# Patient Record
Sex: Female | Born: 1995 | Hispanic: Yes | Marital: Married | Smoking: Never smoker
Health system: Southern US, Community
[De-identification: ages and names within clinical notes are randomized; demographics above are authoritative.]

## PROBLEM LIST (undated history)

## (undated) ENCOUNTER — Inpatient Hospital Stay (HOSPITAL_COMMUNITY): Payer: Self-pay

## (undated) DIAGNOSIS — Z789 Other specified health status: Secondary | ICD-10-CM

---
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
## 2015-01-19 ENCOUNTER — Encounter (HOSPITAL_COMMUNITY): Payer: Self-pay | Admitting: *Deleted

## 2015-01-19 ENCOUNTER — Inpatient Hospital Stay (HOSPITAL_COMMUNITY)
Admission: AD | Admit: 2015-01-19 | Discharge: 2015-01-19 | Disposition: A | Discharge status: Home / Self Care | Payer: Self-pay | Source: Ambulatory Visit | Attending: Family Medicine | Admitting: Family Medicine

## 2015-01-19 DIAGNOSIS — Z975 Presence of (intrauterine) contraceptive device: Secondary | ICD-10-CM

## 2015-01-19 DIAGNOSIS — N939 Abnormal uterine and vaginal bleeding, unspecified: Secondary | ICD-10-CM | POA: Insufficient documentation

## 2015-01-19 DIAGNOSIS — N921 Excessive and frequent menstruation with irregular cycle: Secondary | ICD-10-CM

## 2015-01-19 DIAGNOSIS — R634 Abnormal weight loss: Secondary | ICD-10-CM | POA: Insufficient documentation

## 2015-01-19 LAB — WET PREP, GENITAL
CLUE CELLS WET PREP: NONE SEEN
Trich, Wet Prep: NONE SEEN
YEAST WET PREP: NONE SEEN

## 2015-01-19 LAB — CBC
HCT: 37.6 % (ref 36.0–46.0)
HEMOGLOBIN: 13 g/dL (ref 12.0–15.0)
MCH: 29.9 pg (ref 26.0–34.0)
MCHC: 34.6 g/dL (ref 30.0–36.0)
MCV: 86.4 fL (ref 78.0–100.0)
Platelets: 263 10*3/uL (ref 150–400)
RBC: 4.35 MIL/uL (ref 3.87–5.11)
RDW: 11.1 % — AB (ref 11.5–15.5)
WBC: 7.1 10*3/uL (ref 4.0–10.5)

## 2015-01-19 LAB — URINALYSIS, ROUTINE W REFLEX MICROSCOPIC
Bilirubin Urine: NEGATIVE
Glucose, UA: NEGATIVE mg/dL
Ketones, ur: NEGATIVE mg/dL
LEUKOCYTES UA: NEGATIVE
NITRITE: NEGATIVE
Protein, ur: NEGATIVE mg/dL
Specific Gravity, Urine: >1.03 — ABNORMAL HIGH (ref 1.005–1.030)
UROBILINOGEN UA: 0.2 mg/dL (ref 0.0–1.0)
pH: 5.5 (ref 5.0–8.0)

## 2015-01-19 LAB — POCT PREGNANCY, URINE: Preg Test, Ur: NEGATIVE

## 2015-01-19 LAB — URINE MICROSCOPIC-ADD ON

## 2015-01-19 LAB — TSH: TSH: 0.841 u[IU]/mL (ref 0.350–4.500)

## 2015-01-19 MED ORDER — NORGESTIMATE-ETH ESTRADIOL 0.25-35 MG-MCG PO TABS: 1.0000 | ORAL_TABLET | Freq: Every day | ORAL | Status: DC

## 2015-01-19 NOTE — Progress Notes (Signed)
Assisted RN and midwife with interpretation of patient assessment, results and patient discharge.  Spanish Interpreter

## 2015-01-19 NOTE — MAU Note (Signed)
Pt presents to MAU with complaints of having vaginal bleeding since her delivery in November, has an implanon and vaginal bleeding is getting heavier

## 2015-01-19 NOTE — MAU Provider Note (Signed)
Chief Complaint: Vaginal Bleeding   First Provider Initiated Contact with Patient 01/19/15 1917      SUBJECTIVE HPI: Vicki Boone is a 19 y.o. [id number] female who presents to [facility] reporting vaginal bleeding 2 months. Had Nexplanon placed 2 days postpartum in November. Bled frequently x 2 months, stopped x 1 month and has been bleeding most days x 2 months. Denies abd pain, dizziness, vaginal discharge. She states she is in a mutually monogamous relationship.  Past Medical History  Diagnosis Date  . Medical history non-contributory    OB History  Gravida Para Term Preterm AB SAB TAB Ectopic Multiple Living  2 1 1  0 1 0 1 0 0 1    # Outcome Date GA Lbr Len/2nd Weight Sex Delivery Anes PTL Lv  2 Term 08/20/14 [id number] 133:30 / 00:20 7 lb 3.5 oz (3.275 kg) M Vag-Spont EPI  Y  1 TAB              Comments: for hyperemesis     Past Surgical History  Procedure Laterality Date  . Dilation and curettage, diagnostic / therapeutic  2014    TAB  . Appendectomy  2012   History   Social History  . Marital Status: Married    Spouse Name: N/A  . Number of Children: N/A  . Years of Education: N/A   Occupational History  . Not on file.   Social History Main Topics  . Smoking status: Never Smoker   . Smokeless tobacco: Not on file  . Alcohol Use: No  . Drug Use: No  . Sexual Activity: Yes   Other Topics Concern  . Not on file   Social History Narrative   No current facility-administered medications on file prior to encounter.   Current Outpatient Prescriptions on File Prior to Encounter  Medication Sig Dispense Refill  . ibuprofen (ADVIL,MOTRIN) 600 MG tablet Take 1 tablet (600 mg total) by mouth every 6 (six) hours as needed. 50 tablet 1  . Prenatal Vit-Fe Fumarate-FA (PRENATAL MULTIVITAMIN) TABS tablet Take 1 tablet by mouth at bedtime.      Allergies  Allergen Reactions  . Latex     Vaginal burning with latex condoms   Review of Systems   Constitutional: Positive for weight loss. Negative for fever, chills and malaise/fatigue.  Gastrointestinal: Negative for abdominal pain.  Genitourinary: Negative for hematuria.       Pos fr vaginal bleeding. Neg for vaginal discharge.  Neurological: Negative for dizziness and weakness.  Endo/Heme/Allergies: Does not bruise/bleed easily.   OBJECTIVE Blood pressure 114/77, pulse 77, temperature 98.2 F (36.8 C), resp. rate 18, height 5' 0.5" (1.537 m), weight 93 lb 6 oz (42.355 kg), unknown if currently breastfeeding. Not currently breastfeeding.  GENERAL: Well-developed, well-nourished female in no acute distress. No pallor.  HEENT: Mild thyromegaly HEART: normal rate RESP: normal effort GI: Abdomen soft, non-tender. Positive bowel sounds 4. MS: Nontender, no edema NEURO: Alert and oriented SPECULUM EXAM: NEFG. Small amount of dark red blood noted, cervix clean BIMANUAL: cervix closed; uterus normal size, no adnexal tenderness or masses, No CMT.   LAB RESULTS Results for orders placed or performed during the hospital encounter of 01/19/15 (from the past 24 hour(s))  Urinalysis, Routine w reflex microscopic     Status: Abnormal   Collection Time: 01/19/15  5:40 PM  Result Value Ref Range   Color, Urine YELLOW YELLOW   APPearance CLEAR CLEAR   Specific Gravity, Urine >1.030 (H) 1.005 - 1.030  pH 5.5 5.0 - 8.0   Glucose, UA NEGATIVE NEGATIVE mg/dL   Hgb urine dipstick LARGE (A) NEGATIVE   Bilirubin Urine NEGATIVE NEGATIVE   Ketones, ur NEGATIVE NEGATIVE mg/dL   Protein, ur NEGATIVE NEGATIVE mg/dL   Urobilinogen, UA 0.2 0.0 - 1.0 mg/dL   Nitrite NEGATIVE NEGATIVE   Leukocytes, UA NEGATIVE NEGATIVE  Urine microscopic-add on     Status: None   Collection Time: 01/19/15  5:40 PM  Result Value Ref Range   Squamous Epithelial / LPF RARE RARE   RBC / HPF 21-50 <3 RBC/hpf   Urine-Other MUCOUS PRESENT   Pregnancy, urine POC     Status: None   Collection Time: 01/19/15  5:45 PM   Result Value Ref Range   Preg Test, Ur NEGATIVE NEGATIVE    IMAGING No results found.  MAU COURSE  ASSESSMENT 1. Breakthrough bleeding on Implanon   2. Unexplained weight loss    PLAN Discharge home in stable condition. Bleeding precautions.  TSH, GC/Chlamydia pending.  Follow-up Information    Follow up with [facility] In 4 months.   Specialty:  Obstetrics and Gynecology   Why:  For follow-up visit for prolonged menstrual bleeding with  Nexplanon   Contact information:   [address] [place] [address] [phone]      Follow up with [facility] [place] [facility].   Why:  As needed in emergencies   Contact information:   [address] [id number] mc [place] [address] [phone]       Medication List    TAKE these medications        ibuprofen 600 MG tablet  Commonly known as:  ADVIL,MOTRIN  Take 1 tablet (600 mg total) by mouth every 6 (six) hours as needed.     norgestimate-ethinyl estradiol 0.25-35 MG-MCG tablet  Commonly known as:  ORTHO-CYCLEN,SPRINTEC,PREVIFEM  Take 1 tablet by mouth daily.     prenatal multivitamin Tabs tablet  Take 1 tablet by mouth at bedtime.       [place], [place] 01/19/2015  6:21 PM

## 2015-01-19 NOTE — Discharge Instructions (Signed)
Sangrado uterino anormal (Abnormal Uterine Bleeding) El sangrado uterino anormal puede afectar a las mujeres que estn en diversas etapas de la vida, desde adolescentes, mujeres frtiles y Probation officermujeres embarazadas, hasta mujeres que han llegado a la menopausia. Hay diversas clases de sangrado uterino que se consideran anormales, entre ellas:  Prdidas de sangre o Nationwide Mutual Insurancehemorragias entre los perodos.  Hemorragias luego de Sales promotion account executivemantener relaciones sexuales.  Sangrado abundante o ms que lo habitual.  Perodos que duran ms que lo normal.  Sangrado luego de la menopausia. Muchos casos de sangrado uterino anormal son leves y simples de tratar, mientras que otros son ms graves. El mdico debe evaluar cualquier clase de sangrado anormal. El tratamiento depender de la causa del sangrado. INSTRUCCIONES PARA EL CUIDADO EN EL HOGAR Controle su afeccin para ver si hay cambios. Las siguientes indicaciones ayudarn a Architectural technologistaliviar cualquier molestia que pueda sentir:  Evite las duchas vaginales y el uso de tampones segn las indicaciones del mdico.  Cmbiese las compresas con frecuencia. Deber hacerse exmenes plvicos regulares y pruebas de Papanicolaou. Cumpla con todas las visitas de control y Ciscoexmanes diagnsticos, segn le indique su mdico.  SOLICITE ATENCIN MDICA SI:   El sangrado dura ms de 1 semana.  Se siente mareada por momentos. SOLICITE ATENCIN MDICA DE INMEDIATO SI:   Se desmaya.  Debe cambiarse la compresa cada 15 a 30 minutos.  Siente dolor abdominal.  Lance Mussiene fiebre.  Se siente dbil o presenta sudoracin.  Elimina cogulos grandes por la vagina.  Comienza a sentir nuseas y vomita. ASEGRESE DE QUE:   Comprende estas instrucciones.  Controlar su afeccin.  Recibir ayuda de inmediato si no mejora o si empeora. Document Released: 09/27/2005 Document Revised: 10/02/2013 [facility] Patient Information 2015 HenryExitCare, MarylandLLC. This information is not intended to replace advice given  to you by your health care provider. Make sure you discuss any questions you have with your health care provider.  Etonogestrel implant Qu es este medicamento? El ETONOGESTREL es un dispositivo anticonceptivo (control de la natalidad). Se utiliza para Neurosurgeonprevenir el embarazo. Se puede utilizar hasta 3 aos. Este medicamento puede ser utilizado para otros usos; si tiene alguna pregunta consulte con su proveedor de atencin mdica o con su farmacutico. MARCAS COMERCIALES DISPONIBLES: Implanon, Nexplanon Qu le debo informar a mi profesional de la salud antes de tomar este medicamento? Necesita saber si usted presenta alguno de los siguientes problemas o situaciones: -sangrado vaginal anormal -enfermedad vascular o cogulos sanguneos -cncer de mama, cervical, heptico -depresin -diabetes -enfermedad de la vescula biliar -dolores de cabeza -enfermedad cardiaca o ataque cardiaco reciente -alta presin sangunea -alto nivel de colesterol -enfermedad renal -enfermedad heptica -convulsiones -fuma tabaco -una reaccin alrgica o inusual al etonogestrel, otras hormonas, anestsicos o antispticos, medicamentos, alimentos, colorantes o conservantes -si est embarazada o buscando quedar embarazada -si est amamantando a un beb Cmo debo utilizar este medicamento? Este dispositivo se inserta debajo de la piel en la cara interna de la parte superior del brazo por un profesional de Radiographer, therapeuticla salud. Hable con su pediatra para informarse acerca del uso de este medicamento en nios. Puede requerir atencin especial. Sobredosis: Pngase en contacto inmediatamente con un centro toxicolgico o una sala de urgencia si usted cree que haya tomado demasiado medicamento. ATENCIN: Reynolds AmericanEste medicamento es solo para usted. No comparta este medicamento con nadie. Qu sucede si me olvido de una dosis? No se aplica en este caso. Qu puede interactuar con este medicamento? No tome esta medicina con ninguno de los  siguientes medicamentos: -amprenavir -bosentano -fosamprenavir  Esta medicina tambin puede interactuar con los siguientes medicamentos: -medicamentos barbitricos para inducir el sueo o tratar convulsiones -ciertos medicamentos para las infecciones micticas tales como quetoconazol e itraconazol -griseofulvina -medicamentos para tratar convulsiones, tales como carbamazepina, felbamato, oxcarbazepina, fenitona, topiramato -modafinil -fenilbutazona -rifampicina -algunos medicamentos para tratar la infeccin por VIH tales como atazanavir, indinavir, lopinavir, nelfinavir, tipranavir, ritonavir -hierba de [address] ser que esta lista no menciona todas las posibles interacciones. Informe a su profesional de [employer] de [employer] productos a base de hierbas, medicamentos de [place] o suplementos nutritivos que est tomando. Si usted fuma, consume bebidas alcohlicas o si utiliza drogas ilegales, indqueselo tambin a su profesional de [employer]. Algunas sustancias pueden interactuar con su medicamento. A qu debo estar atento al usar [employer]? Este medicamento no protege contra la infeccin por el VIH (SIDA) o otras enfermedades de transmisin sexual. Usted debe sentir el implante al presionar con las yemas de los dedos sobre la piel donde se insert. Dgale a su mdico si no se siente el implante. Qu efectos secundarios puedo tener al [employer] este medicamento? Efectos secundarios que debe informar a su mdico o a Producer, television/film/video de la salud tan pronto como sea posible: -Therapist, art como erupcin cutnea, picazn o urticarias, hinchazn de la cara, labios o lengua -ndulos mamarios -cambios en la visin -confusin, dificultad para hablar o entender -orina de color oscura -humor deprimido -sensacin general de estar enfermo o sntomas gripales -heces claras -prdida del apetito, nuseas -dolor en la regin abdominal superior derecha -dolores de Scientist, clinical (histocompatibility and immunogenetics), hinchazon o sensibilidad grave en el abdomen -falta de aliento, dolor en el pecho, hinchazn de la pierna -seales de Chartered loss adjuster -entumecimiento o debilidad repentina de la cara, brazo o pierna -dificultad para caminar, mareos, prdida de equilibrio o coordinacin -sangrado o flujo vaginal inusual -cansancio o debilidad inusual -color amarillento de los ojos o la piel Efectos secundarios que, por lo general, no requieren Psychologist, prison and probation services (debe informarlos a su mdico o a Producer, television/film/video de la salud si persisten o si son molestos): -acn -dolor de pecho -cambios de peso -tos -fiebre o escalofros -dolor de cabeza -sangrado menstrual irregular -picazn, ardo o flujo vaginal -dolor o dificultad para Geographical information systems officer -dolor de garganta Puede ser que esta lista no menciona todos los posibles efectos secundarios. Comunquese a su mdico por asesoramiento mdico [employer]. Usted puede informar los efectos secundarios a la FDA por telfono al 1-800-FDA-1088. Dnde debo guardar mi medicina? Este medicamento se administra en hospitales o clnicas y no necesitar guardarlo en su domicilio. ATENCIN: Este folleto es un resumen. Puede ser que no cubra toda la posible informacin. Si usted tiene preguntas acerca de esta medicina, consulte con su mdico, su farmacutico o su profesional de Radiographer, therapeutic.  2015, Elsevier/Gold Standard. (2012-05-04 18:26:08)

## 2015-01-19 NOTE — Progress Notes (Signed)
Assisted RN with interpretation of patient assessment.   °Spanish Interpreter °

## 2015-01-20 ENCOUNTER — Telehealth: Payer: Self-pay | Admitting: General Practice

## 2015-01-20 LAB — GC/CHLAMYDIA PROBE AMP (~~LOC~~) NOT AT ARMC
CHLAMYDIA, DNA PROBE: NEGATIVE
NEISSERIA GONORRHEA: NEGATIVE

## 2015-01-20 NOTE — Telephone Encounter (Signed)
Called patient with Vicki Boone for interpreter and informed patient of results. Patient verbalized understanding and asked about other results. Told patient that some results have come back normal but others haven't resulted yet. Patient verbalized understanding and had no other questions

## 2015-01-20 NOTE — Telephone Encounter (Signed)
-----   Message from Vicki Boone, [place] sent at 01/20/2015  8:26 AM EDT ----- Please inform pt of normal TSH.

## 2015-05-22 ENCOUNTER — Ambulatory Visit: Payer: Self-pay | Admitting: Family Medicine

## 2015-05-24 ENCOUNTER — Emergency Department (HOSPITAL_COMMUNITY): Payer: Self-pay

## 2015-05-24 ENCOUNTER — Encounter (HOSPITAL_COMMUNITY): Payer: Self-pay | Admitting: Oncology

## 2015-05-24 ENCOUNTER — Emergency Department (HOSPITAL_COMMUNITY)
Admission: EM | Admit: 2015-05-24 | Discharge: 2015-05-25 | Disposition: A | Discharge status: Home / Self Care | Payer: Self-pay | Attending: Emergency Medicine | Admitting: Emergency Medicine

## 2015-05-24 DIAGNOSIS — Z91040 Latex allergy status: Secondary | ICD-10-CM | POA: Insufficient documentation

## 2015-05-24 DIAGNOSIS — R112 Nausea with vomiting, unspecified: Secondary | ICD-10-CM | POA: Insufficient documentation

## 2015-05-24 DIAGNOSIS — R202 Paresthesia of skin: Secondary | ICD-10-CM | POA: Insufficient documentation

## 2015-05-24 DIAGNOSIS — R509 Fever, unspecified: Secondary | ICD-10-CM | POA: Insufficient documentation

## 2015-05-24 DIAGNOSIS — M79642 Pain in left hand: Secondary | ICD-10-CM | POA: Insufficient documentation

## 2015-05-24 DIAGNOSIS — R1084 Generalized abdominal pain: Secondary | ICD-10-CM | POA: Insufficient documentation

## 2015-05-24 DIAGNOSIS — Z79899 Other long term (current) drug therapy: Secondary | ICD-10-CM | POA: Insufficient documentation

## 2015-05-24 DIAGNOSIS — F410 Panic disorder [episodic paroxysmal anxiety] without agoraphobia: Secondary | ICD-10-CM | POA: Insufficient documentation

## 2015-05-24 DIAGNOSIS — M79641 Pain in right hand: Secondary | ICD-10-CM | POA: Insufficient documentation

## 2015-05-24 LAB — CBC WITH DIFFERENTIAL/PLATELET
Basophils Absolute: 0 10*3/uL (ref 0.0–0.1)
Basophils Relative: 0 % (ref 0–1)
EOS PCT: 1 % (ref 0–5)
Eosinophils Absolute: 0.1 10*3/uL (ref 0.0–0.7)
HCT: 39.4 % (ref 36.0–46.0)
Hemoglobin: 13.6 g/dL (ref 12.0–15.0)
Lymphocytes Relative: 9 % — ABNORMAL LOW (ref 12–46)
Lymphs Abs: 1.7 10*3/uL (ref 0.7–4.0)
MCH: 29.6 pg (ref 26.0–34.0)
MCHC: 34.5 g/dL (ref 30.0–36.0)
MCV: 85.8 fL (ref 78.0–100.0)
Monocytes Absolute: 1.6 10*3/uL — ABNORMAL HIGH (ref 0.1–1.0)
Monocytes Relative: 8 % (ref 3–12)
NEUTROS PCT: 82 % — AB (ref 43–77)
Neutro Abs: 15.8 10*3/uL — ABNORMAL HIGH (ref 1.7–7.7)
PLATELETS: 348 10*3/uL (ref 150–400)
RBC: 4.59 MIL/uL (ref 3.87–5.11)
RDW: 11.9 % (ref 11.5–15.5)
WBC: 19.2 10*3/uL — AB (ref 4.0–10.5)

## 2015-05-24 LAB — COMPREHENSIVE METABOLIC PANEL
ALK PHOS: 74 U/L (ref 38–126)
ALT: 32 U/L (ref 14–54)
AST: 29 U/L (ref 15–41)
Albumin: 4.6 g/dL (ref 3.5–5.0)
Anion gap: 12 (ref 5–15)
BUN: 12 mg/dL (ref 6–20)
CALCIUM: 9.3 mg/dL (ref 8.9–10.3)
CO2: 21 mmol/L — AB (ref 22–32)
CREATININE: 0.57 mg/dL (ref 0.44–1.00)
Chloride: 106 mmol/L (ref 101–111)
GFR calc non Af Amer: >60 mL/min (ref >60)
Glucose, Bld: 88 mg/dL (ref 65–99)
POTASSIUM: 3.1 mmol/L — AB (ref 3.5–5.1)
Sodium: 139 mmol/L (ref 135–145)
Total Bilirubin: 1.4 mg/dL — ABNORMAL HIGH (ref 0.3–1.2)
Total Protein: 8.1 g/dL (ref 6.5–8.1)

## 2015-05-24 LAB — LIPASE, BLOOD: Lipase: 23 U/L (ref 22–51)

## 2015-05-24 MED ORDER — ONDANSETRON HCL 4 MG/2ML IJ SOLN
4.0000 mg | Freq: Once | INTRAMUSCULAR | Status: AC
  Administered 2015-05-24: 4 mg via INTRAVENOUS
  Filled 2015-05-24: qty 2

## 2015-05-24 MED ORDER — SODIUM CHLORIDE 0.9 % IV BOLUS (SEPSIS)
500.0000 mL | Freq: Once | INTRAVENOUS | Status: AC
  Administered 2015-05-24: 500 mL via INTRAVENOUS

## 2015-05-24 MED ORDER — MORPHINE SULFATE 4 MG/ML IJ SOLN
4.0000 mg | Freq: Once | INTRAMUSCULAR | Status: AC
  Administered 2015-05-24: 4 mg via INTRAVENOUS
  Filled 2015-05-24: qty 1

## 2015-05-24 NOTE — ED Notes (Signed)
Nurse starting IV 

## 2015-05-24 NOTE — ED Notes (Signed)
Bed: [facility]3 Expected date:  Expected time:  Means of arrival:  Comments: EMS 19 yo panic attack

## 2015-05-24 NOTE — ED Notes (Signed)
Per EMS he was flagged down by a car w/ a female pt having a panic attack.  Pt c/o abdominal and leg pain.  Pt has calmed down since arrival.

## 2015-05-24 NOTE — ED Provider Notes (Signed)
CSN: [id number]     Arrival date & time 05/24/15  1940 History  This chart was scribed for non-physician practitioner Earley Favor, NP, working with Mancel Bale, MD, by Tanda Rockers, ED Scribe. This patient was seen in room WTR3/[facility]3 and the patient's care was started at 8:25 PM.  Chief Complaint  Patient presents with  . Panic Attack  . Abdominal Pain   The history is provided by the patient. The history is limited by a language barrier. A language interpreter was used.     HPI Comments: Vicki Boone is a 19 y.o. female brought in by ambulance, who presents to the Emergency Department complaining of sudden onset abdominal pain that began earlier today. Pt also complains of abdominal bloating, nausea, vomiting, subjective fever, chills, bilateral hand pain, and a tingling sensation all over her body. Pt mentions that she felt as if her body was "shrinking." She also mentions that she could not move or talk. All these symptoms lasted approximately 30 minutes before subsided. Pt is currently only complaining of generalized body aches. She states that she felt perfectly normal prior to onset of symptoms. She has never had symptoms like this in the past. 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 Denies dysuria, vaginal discharge, or any other associated symptoms. LNMP: Today.   Past Medical History  Diagnosis Date  . Medical history non-contributory    Past Surgical History  Procedure Laterality Date  . Dilation and curettage, diagnostic / therapeutic  2014    TAB  . Appendectomy  2012   No family history on file. Social History  Substance Use Topics  . Smoking status: Never Smoker   . Smokeless tobacco: None  . Alcohol Use: No   OB History    Gravida Para Term Preterm AB TAB SAB Ectopic Multiple Living   2 1 1  0 1 1 0 0 0 1     Review of Systems   Constitutional: Positive for fever and chills.  HENT: Negative for rhinorrhea.   Respiratory: Negative for cough and shortness of breath.   Cardiovascular: Negative for chest pain.  Gastrointestinal: Positive for nausea, vomiting, abdominal pain and abdominal distention. Negative for diarrhea and constipation.  Genitourinary: Negative for dysuria, frequency, decreased urine volume, vaginal discharge, vaginal pain and pelvic pain.  Musculoskeletal: Positive for myalgias and arthralgias (Bilateral hand pain).  All other systems reviewed and are negative.  Allergies  Latex  Home Medications   Prior to Admission medications   Medication Sig Start Date End Date Taking? Authorizing Provider  Prenatal Vit-Fe Fumarate-FA (PRENATAL MULTIVITAMIN) TABS tablet Take 1 tablet by mouth at bedtime.    Yes Historical Provider, MD  norgestimate-ethinyl estradiol (ORTHO-CYCLEN,SPRINTEC,PREVIFEM) 0.25-35 MG-MCG tablet Take 1 tablet by mouth daily. Patient not taking: Reported on 05/24/2015 01/19/15   Dorathy Kinsman, CNM  ondansetron (ZOFRAN ODT) 4 MG disintegrating tablet Take 1 tablet (4 mg total) by mouth every 8 (eight) hours as needed for nausea or vomiting. 05/25/15   Earley Favor, NP   Triage Vitals: BP 122/74 mmHg  Pulse 98  Temp(Src) 98.9 F (37.2 C) (Oral)  Resp 20  Ht 5\' 3"  (1.6 m)  Wt 90 lb (40.824 kg)  BMI 15.95 kg/m2  SpO2 100%   Physical Exam  Constitutional: She is oriented to person, place, and time. She appears well-developed and well-nourished.  HENT:  Head: Normocephalic.  Eyes: Pupils are equal, round, and reactive to light.  Neck: Normal range of motion.  Cardiovascular: Normal rate and regular rhythm.   Abdominal: She exhibits no distension and no mass. There is tenderness in the epigastric area and left upper quadrant. There is no rebound and no guarding.  Musculoskeletal: Normal range of motion.  Neurological: She is alert and oriented to person, place, and time.  Skin:  Skin is warm and dry.  Nursing note and vitals reviewed.   ED Course  Procedures (including critical care time)  DIAGNOSTIC STUDIES: Oxygen Saturation is 100% on RA, normal by my interpretation.    COORDINATION OF CARE: 8:39 PM-Discussed treatment plan which includes UA, Lipase, CMP, CBC with pt at bedside and pt agreed to plan.   Labs Review Labs Reviewed  CBC WITH DIFFERENTIAL/PLATELET - Abnormal; Notable for the following:    WBC 19.2 (*)    Neutrophils Relative % 82 (*)    Neutro Abs 15.8 (*)    Lymphocytes Relative 9 (*)    Monocytes Absolute 1.6 (*)    All other components within normal limits  URINALYSIS, ROUTINE W REFLEX MICROSCOPIC (NOT AT [facility]) - Abnormal; Notable for the following:    Color, Urine AMBER (*)    APPearance CLOUDY (*)    Hgb urine dipstick LARGE (*)    Ketones, ur 40 (*)    Leukocytes, UA TRACE (*)    All other components within normal limits  COMPREHENSIVE METABOLIC PANEL - Abnormal; Notable for the following:    Potassium 3.1 (*)    CO2 21 (*)    Total Bilirubin 1.4 (*)    All other components within normal limits  URINE MICROSCOPIC-ADD ON - Abnormal; Notable for the following:    Squamous Epithelial / LPF FEW (*)    Bacteria, UA FEW (*)    All other components within normal limits  LIPASE, BLOOD    Imaging Review US Abdomen Complete  05/25/2015   CLINICAL DATA:  Acute onset of generalized abdominal pain. Initial encounter.  EXAM: ULTRASOUND ABDOMEN COMPLETE  COMPARISON:  None.  FINDINGS: Gallbladder: No gallstones or wall thickening visualized. No sonographic Murphy sign noted.  Common bile duct: Diameter: 0.2 cm, within normal limits in caliber.  Liver: No focal lesion identified. Within normal limits in parenchymal echogenicity.  IVC: No abnormality visualized.  Pancreas: Visualized portion unremarkable.  Spleen: Size and appearance within normal limits.  Right Kidney: Length: 9.9 cm. Echogenicity within normal limits. No mass or hydronephrosis  visualized.  Left Kidney: Length: 10.2 cm. Echogenicity within normal limits. No mass or hydronephrosis visualized.  Abdominal aorta: No aneurysm visualized.  Other findings: None.  IMPRESSION: Unremarkable abdominal ultrasound.   Electronically Signed   By: Roanna Raider M.D.   On: 05/25/2015 00:01   Ct Abdomen Pelvis W Contrast  05/25/2015   CLINICAL DATA:  Acute onset of generalized abdominal pain, nausea, vomiting and bloating. Leukocytosis.  EXAM: CT ABDOMEN AND PELVIS WITH CONTRAST  TECHNIQUE: Multidetector CT imaging of the abdomen and pelvis was performed using the standard protocol following bolus administration of intravenous contrast.  CONTRAST:  OMNIPAQUE IOHEXOL 300 MG/ML  SOLN  COMPARISON:  Abdominal ultrasound performed 05/24/2015  FINDINGS: The visualized lung bases are clear.  The liver and spleen are unremarkable in appearance. The gallbladder is within normal limits. The pancreas and adrenal glands are unremarkable.  The kidneys are unremarkable in appearance. There is no evidence of hydronephrosis. No renal or ureteral stones are seen. No perinephric stranding  is appreciated.  The small bowel is unremarkable in appearance. The stomach is within normal limits. No acute vascular abnormalities are seen.  The colon is unremarkable in appearance. The patient is status post appendectomy.  The bladder is mildly distended and grossly unremarkable. The uterus is unremarkable in appearance. The ovaries are grossly symmetric. No suspicious adnexal masses are seen. Trace fluid in the pelvis is likely physiologic in nature. Mild prominence of the periuterine vasculature is likely within normal limits. No inguinal lymphadenopathy is seen.  No acute osseous abnormalities are identified.  IMPRESSION: Unremarkable contrast-enhanced CT of the abdomen and pelvis.   Electronically Signed   By: Roanna Raider M.D.   On: 05/25/2015 05:43      EKG Interpretation None     Labs showed that the patient  has an elevated white count of 19.2 and elevated bilirubin 1.4.  The rest of her LFTs are within normal parameters.  Due to the location of her pain in the right upper and left upper quadrant I have opted to obtain an ultrasound to rule out gallbladder disease, as she is 9 months postpartum.  Her pain started approximately 3 hours after eating Received IV fluid, IV Zofran.  Abdominal ultrasound reveals no abnormal pathology.  She has tolerated fluids.  She'll be with a prescription for Zofran At time of discharge, she walked to the bathroom with assistance of her husband shortly after having a bowel movement.  She felt faint and weak.  She had to be carried back to the room stating that she got hot and anxious.  He should has been sleeping soundly.  No longer complaining of any pain.  She's not had any more bowel movements.  She will be ambulated.  If she is successful with ambulation.  She will be discharged home. MDM   Final diagnoses:  Generalized abdominal pain    I personally performed the services described in this documentation, which was scribed in my presence. The recorded information has been reviewed and is accurate.     Earley Favor, NP 05/25/15 0325  Earley Favor, NP 05/25/15 1610  Mancel Bale, MD 05/25/15 1259

## 2015-05-25 ENCOUNTER — Emergency Department (HOSPITAL_COMMUNITY): Payer: Self-pay

## 2015-05-25 LAB — URINALYSIS, ROUTINE W REFLEX MICROSCOPIC
Bilirubin Urine: NEGATIVE
Glucose, UA: NEGATIVE mg/dL
Ketones, ur: 40 mg/dL — AB
NITRITE: NEGATIVE
Protein, ur: NEGATIVE mg/dL
Specific Gravity, Urine: 1.027 (ref 1.005–1.030)
Urobilinogen, UA: 1 mg/dL (ref 0.0–1.0)
pH: 5.5 (ref 5.0–8.0)

## 2015-05-25 LAB — URINE MICROSCOPIC-ADD ON

## 2015-05-25 MED ORDER — IOHEXOL 300 MG/ML  SOLN
100.0000 mL | Freq: Once | INTRAMUSCULAR | Status: AC | PRN
  Administered 2015-05-25: 100 mL via INTRAVENOUS

## 2015-05-25 MED ORDER — IOHEXOL 300 MG/ML  SOLN
50.0000 mL | Freq: Once | INTRAMUSCULAR | Status: AC | PRN
  Administered 2015-05-25: 50 mL via ORAL

## 2015-05-25 MED ORDER — ONDANSETRON 4 MG PO TBDP: 4.0000 mg | ORAL_TABLET | Freq: Three times a day (TID) | ORAL | Status: DC | PRN

## 2015-05-25 NOTE — ED Notes (Signed)
Pt resting quietly without complaints.

## 2015-05-25 NOTE — ED Notes (Signed)
Pt ambulated without difficulty,  She states she is more relaxed now.

## 2015-05-25 NOTE — ED Notes (Signed)
Pt is in CT

## 2015-05-25 NOTE — ED Notes (Signed)
Patient carried back to room by significant other, stating after using the bathroom, "pooped a little"-states solid, states became faint feeling.  Patient is pale and is unable to stand without assist when performing orthostats.

## 2015-05-25 NOTE — ED Notes (Signed)
Pt returned from CT °

## 2015-05-25 NOTE — Discharge Instructions (Signed)
Dolor abdominal en las mujeres °(Abdominal Pain, Women) °El dolor abdominal (en el estómago, la pelvis o el vientre) puede tener muchas causas. Es importante que le informe a su médico: °· La ubicación del dolor. °· ¿Viene y se va, o persiste todo el tiempo? °· ¿Hay situaciones que inician el dolor (comer ciertos alimentos, la actividad física)? °· ¿Tiene otros síntomas asociados al dolor (fiebre, náuseas, vómitos, diarrea)? °Todo es de gran ayuda cuando se trata de hallar la causa del dolor. °CAUSAS °· Estómago: Infecciones por virus o bacterias, o úlcera. °· Intestino: Apendicitis (apéndice inflamado), ileitis regional (enfermedad de Crohn), colitis ulcerosa (colon inflamado), síndrome del colon irritable, diverticulitis (inflamación de los divertículos del colon) o cáncer de estómago oo intestino. °· Enfermedades de la vesícula biliar o cálculos. °· Enfermedades renales, cálculos o infecciones en el riñón. °· Infección o cáncer del páncreas. °· Fibromialgia (trastorno doloroso) °· Enfermedades de los órganos femeninos: °¨ Uterus: Útero: fibroma (tumor no canceroso) o infección °¨ Trompas de Falopio: infección o embarazo ectópico °¨ En los ovarios, quistes o tumores. °¨ Adherencias pélvicas (tejido cicatrizal). °¨ Endometriosis (el tejido que cubre el útero se desarrolla en la pelvis y los órganos pélvicos). °¨ Síndrome de congestión pélvica (los órganos femeninos se llenan de sangre antes del periodo menstrual( °¨ Dolor durante el periodo menstrual. °¨ Dolor durante la ovulación (al producir óvulos). °¨ Dolor al usar el DIU (dispositivo intrauterino para el control de la natalidad) °¨ Cáncer en los órganos femeninos. °· Dolor funcional (no está originado en una enfermedad, puede mejorar sin tratamiento). °· Dolor de origen psicológico °· Depresión. °DIAGNÓSTICO °Su médico decidirá la gravedad del dolor a través del examen físico °· Análisis de sangre °· Radiografías °· Ecografías °· TC (tomografía computada, tipo  especial de radiografías). °· IMR (resonancia magnética) °· Cultivos, en el caso una infección °· Colon por enema de bario (se inserta una sustancia de contraste en el intestino grueso para mejorar la observación con rayos X.) °· Colonoscopía (observación del intestino con un tubo luminoso). °· Laparoscopía (examen del interior del abdomen con un tubo que tiene una luz). °· Cirugía exploratoria abdominal mayor (se observa el abdomen realizando una gran incisión). °TRATAMIENTO °El tratamiento dependerá de la causa del problema.  °· Muchos de estos casos pueden controlarse y tratarse en casa. °· Medicamentos de venta libre indicados por el médico. °· Medicamentos con receta. °· Antibióticos, en caso de infección °· Píldoras anticonceptivas, en el caso de períodos dolorosos o dolor al ovular. °· Tratamiento hormonal, para la endometriosis °· Inyecciones para bloqueo nervioso selectivo. °· Fisioterapia. °· Antidepresivos. °· Consejos por parte de un psícólogo o psiquiatra. °· Cirugía mayor o menor. °INSTRUCCIONES PARA EL CUIDADO DOMICILIARIO °· No tome ni administre laxantes a menos que se lo haya indicado su médico. °· Tome analgésicos de venta libre sólo si se lo ha indicado el profesional que lo asiste. No tome aspirina, ya que puede causar molestias en el estómago o hemorragias. °· Consuma una dieta líquida (caldo o agua) según lo indicado por el médico. Progrese lentamente a una dieta blanda, según la tolerancia, si el dolor se relaciona con el estómago o el intestino. °· Tenga un termómetro y tómese la temperatura varias veces al día. °· Haga reposo en la cama y duerma, si esto alivia el dolor. °· Evite las relaciones sexuales, si le producen dolor. °· Evite las situaciones estresantes. °· Cumpla con las visitas y los análisis de control, según las indicaciones de su médico. °· Si el dolor   no se alivia con los medicamentos o la cirugía, puede tratar con: °¨ Acupuntura. °¨ Ejercicios de relajación (yoga,  meditación). °¨ Terapia grupal. °¨ Psicoterapia. °SOLICITE ATENCIÓN MÉDICA SI: °· Nota que ciertos alimentos le producen dolor de estómago. °· El tratamiento indicado para realizar en el hogar no le alivia el dolor. °· Necesita analgésicos más fuertes. °· Quiere que le retiren el DIU. °· Si se siente confundido o desfalleciente. °· Presenta náuseas o vómitos. °· Aparece una erupción cutánea. °· Sufre efectos adversos o una reacción alérgica debido a los medicamentos que toma. °SOLICITE ATENCIÓN MÉDICA DE INMEDIATO SI: °· El dolor persiste o se agrava. °· Tiene fiebre. °· Siente el dolor sólo en algunos sectores del abdomen. Si se localiza en la zona derecha, posiblemente podría tratarse de apendicitis. En un adulto, si se localiza en la región inferior izquierda del abdomen, podría tratarse de colitis o diverticulitis. °· Hay sangre en las heces (deposiciones de color rojo brillante o negro alquitranado), con o sin vómitos. °· Usted presenta sangre en la orina. °· Siente escalofríos con o sin fiebre. °· Se desmaya. °ASEGÚRESE QUE:  °· Comprende estas instrucciones. °· Controlará su enfermedad. °· Solicitará ayuda de inmediato si no mejora o si empeora. °Document Released: 01/13/2009 Document Revised: 12/20/2011 °ExitCare® Patient Information ©2015 [employer], LLC. This information is not intended to replace advice given to you by your health care provider. Make sure you discuss any questions you have with your health care provider. ° °

## 2016-02-11 ENCOUNTER — Ambulatory Visit (INDEPENDENT_AMBULATORY_CARE_PROVIDER_SITE_OTHER): Payer: PRIVATE HEALTH INSURANCE | Admitting: Family Medicine

## 2016-02-11 VITALS — BP 112/68 | HR 79 | Temp 98.7°F | Resp 16 | Ht 61.5 in | Wt 93.4 lb

## 2016-02-11 DIAGNOSIS — G4700 Insomnia, unspecified: Secondary | ICD-10-CM | POA: Diagnosis not present

## 2016-02-11 DIAGNOSIS — L659 Nonscarring hair loss, unspecified: Secondary | ICD-10-CM | POA: Diagnosis not present

## 2016-02-11 DIAGNOSIS — R1013 Epigastric pain: Secondary | ICD-10-CM

## 2016-02-11 DIAGNOSIS — R634 Abnormal weight loss: Secondary | ICD-10-CM

## 2016-02-11 DIAGNOSIS — L853 Xerosis cutis: Secondary | ICD-10-CM | POA: Diagnosis not present

## 2016-02-11 LAB — CBC
HCT: 40.6 % (ref 35.0–45.0)
HEMOGLOBIN: 13.6 g/dL (ref 11.7–15.5)
MCH: 29.6 pg (ref 27.0–33.0)
MCHC: 33.5 g/dL (ref 32.0–36.0)
MCV: 88.3 fL (ref 80.0–100.0)
MPV: 9.2 fL (ref 7.5–12.5)
Platelets: 321 10*3/uL (ref 140–400)
RBC: 4.6 MIL/uL (ref 3.80–5.10)
RDW: 12.8 % (ref 11.0–15.0)
WBC: 7.1 10*3/uL (ref 3.8–10.8)

## 2016-02-11 MED ORDER — RANITIDINE HCL 150 MG PO TABS: 150.0000 mg | ORAL_TABLET | Freq: Two times a day (BID) | ORAL | Status: AC

## 2016-02-11 MED ORDER — HYDROXYZINE HCL 25 MG PO TABS: 12.5000 mg | ORAL_TABLET | Freq: Every evening | ORAL | Status: DC | PRN

## 2016-02-11 NOTE — Patient Instructions (Addendum)
  Por favor, tome ranitidina dos veces al da durante un mes para su estmago.  Hydroxyzine es un medicamento para que usted tome a la hora de acostarse para ayudarle a Occupational psychologistdescansar mejor. Espero que esto le ayude a tener ms energa y sentirse mejor Administratordurante el da.  Por favor regrese a la clnica para verme el sbado, 24 de junio en cualquier Nordstrommomento entre 8-4.  Le enviar un mensaje sobre su trabajo de sangre a travs de la computadora (Mychart)Por favor llame a uno de estos terapeutas y haga una cita para discutir su enojo, frustracin y estresores Arbutus PedJoe Alvarez [phone] Hilma FavorsMary Ann Garcia [phone]     IF you received an x-ray today, you will receive an invoice from [facility]. Please contact [facility] at [phone] with questions or concerns regarding your invoice.   IF you received labwork today, you will receive an invoice from [facility]. Please contact [facility] at [phone] with questions or concerns regarding your invoice.   Our billing staff will not be able to assist you with questions regarding bills from these companies.  You will be contacted with the lab results as soon as they are available. The fastest way to get your results is to activate your My Chart account. Instructions are located on the last page of this paperwork. If you have not heard from us regarding the results in 2 weeks, please contact this office.

## 2016-02-11 NOTE — Progress Notes (Signed)
Subjective:    Patient ID: Vicki Boone, female    DOB: 01-Jul-1996, 20 y.o.   MRN: [id number]  HPI This is a pleasant 20 yo female. She is Spanish speaking and the interpretation service was utilized with provider Eulis FosterAlberto.   The patient presents today with 2 weeks of abdominal pain and nausea. She vomited a small amount today, but has not had other vomiting. She has stomach pain in the morning which gets better over time. She has had this type of pain in the past and has taken omeprazole with good relief. Food makes pain worse and makes her feel gassy. She feels hungry. She is having a formed bowel movement every day. Occasionally sees mucus in her stool. No blood or dark stools.   She has been having long periods even with Nexplanon and is taking OCPs (unknown type) to control breakthrough bleeding on Nexplanon. She is going to have her Nexplanon removed and just use OCPs.    She has noticed her hair has been falling out. She has been told this is from stress. Her skin has been dry. She has been married for 2 years and has an 7018 month old son. She lives with her husband, son, brother and mother. She works very early in the morning at the [employer]. Her husband does not help her like she would like. Her son always wants her. He sleeps in the room with she and her husband in a crib. Her mother helps her with her son, putting him to bed at night so she can get some sleep. The baby wakes several times through the night for milk. He goes back to sleep after drinking a couple of ounces, but she has difficulty falling back to sleep. Her husband says she is very restless. She is aware of dreaming some of the time. She does not feel rested. She feels very stressed, her mother treats her like a child and her husband acts like a boy. Her son goes to sleep around 11 pm and takes one nap daily. He awakens around 6-7 am.  Past Medical History  Diagnosis Date  . Medical history non-contributory     Past Surgical History  Procedure Laterality Date  . Dilation and curettage, diagnostic / therapeutic  2014    TAB  . Appendectomy  2012   History reviewed. No pertinent family history. Social History  Substance Use Topics  . Smoking status: Never Smoker   . Smokeless tobacco: None  . Alcohol Use: No    Review of Systems Per HPI    Objective:   Physical Exam  Constitutional: She is oriented to person, place, and time. She appears well-developed and well-nourished. No distress.  thin  HENT:  Head: Normocephalic and atraumatic.  Eyes: Conjunctivae are normal.  Neck: Normal range of motion. Neck supple.  Cardiovascular: Normal rate, regular rhythm and normal heart sounds.   Pulmonary/Chest: Effort normal and breath sounds normal.  Abdominal: Soft. Bowel sounds are normal. She exhibits no distension and no mass. There is tenderness (mild, generalized eipigastric). There is no rebound and no guarding.  Musculoskeletal: Normal range of motion.  Neurological: She is alert and oriented to person, place, and time.  Skin: Skin is warm and dry. She is not diaphoretic.  Psychiatric: She has a normal mood and affect. Her behavior is normal. Judgment and thought content normal.  Vitals reviewed.     BP 112/68 mmHg  Pulse 79  Temp(Src) 98.7 F (37.1 C) (Oral)  Resp 16  Ht 5' 1.5" (1.562 m)  Wt 93 lb 6.4 oz (42.366 kg)  BMI 17.36 kg/m2  SpO2 100% Wt Readings from Last 3 Encounters:  02/11/16 93 lb 6.4 oz (42.366 kg) (1 %*, Z = -2.45)  05/24/15 90 lb (40.824 kg) (0 %*, Z = -2.82)  01/19/15 93 lb 6 oz (42.355 kg) (1 %*, Z = -2.41)   * Growth percentiles are based on CDC 2-20 Years data.       Assessment & Plan:  1. Epigastric pain - she has been seen with this complaint previously and I suspect stress and fatigue are playing a large role in her pain - encouraged frequent small, bland meals  - CBC - ranitidine (ZANTAC) 150 MG tablet; Take 1 tablet (150 mg total) by mouth 2  (two) times daily.  Dispense: 60 tablet; Refill: 1  2. Hair loss - CBC - Thyroid Panel With TSH  3. Dry skin - CBC - Thyroid Panel With TSH  4. Loss of weight - CBC - Thyroid Panel With TSH  5. Insomnia - hydrOXYzine (ATARAX/VISTARIL) 25 MG tablet; Take 0.5-1 tablets (12.5-25 mg total) by mouth at bedtime as needed (sleep.).  Dispense: 30 tablet; Refill: 0 - provided information about her baby's developmental stage and encouraged her to wean him from having milk at night and allowing him to self sooth and return to sleep.  6. Anxiety - I provided her with names of two Spanish speaking therapists and encouraged her to make an appointment  - follow up in 1 months, sooner if worsening symptoms  Olean Ree, FNP-BC  [facility], [facility]  02/16/2016 10:08 PM

## 2016-02-12 LAB — THYROID PANEL WITH TSH
Free Thyroxine Index: 2.4 (ref 1.4–3.8)
T3 Uptake: 28 % (ref 22–35)
T4 TOTAL: 8.4 ug/dL (ref 4.5–12.0)
TSH: 1.49 m[IU]/L (ref 0.50–4.30)

## 2016-04-05 IMAGING — CT CT ABD-PELV W/ CM
2 of 4 series · 16 of 46 positions shown, 18 images · IV contrast (OMNIPAQUE 300)
Comparison: Abdominal ultrasound performed 05/24/2015

CLINICAL DATA: Acute onset of generalized abdominal pain, nausea,
vomiting and bloating. Leukocytosis.

EXAM:
CT ABDOMEN AND PELVIS WITH CONTRAST
TECHNIQUE: Multidetector CT imaging of the abdomen and pelvis was performed
using the standard protocol following bolus administration of
intravenous contrast.
CONTRAST:  100mL OMNIPAQUE IOHEXOL 300 MG/ML  SOLN

[Series 2: abd/pel with · axial · 0.63mm/px · z∈[-398,-63]mm · 13 of 75 slices shown, 15 images]
[im 4/75  soft-tissue]
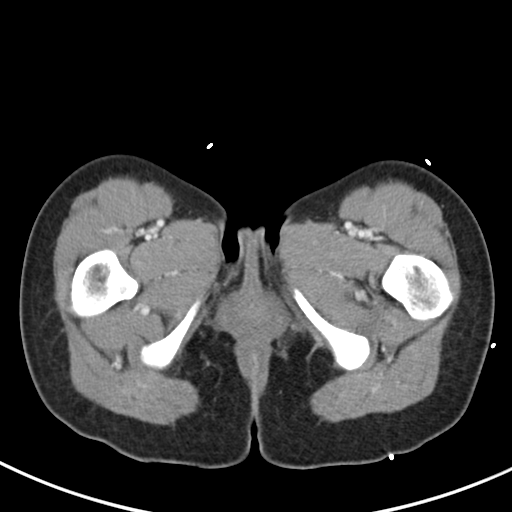
[im 4/75  bone]
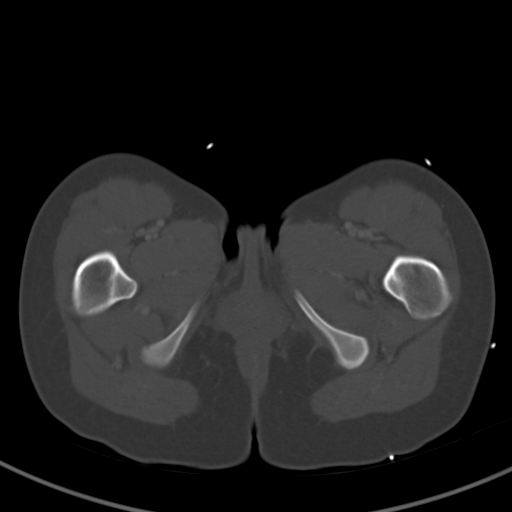
[im 10/75  soft-tissue]
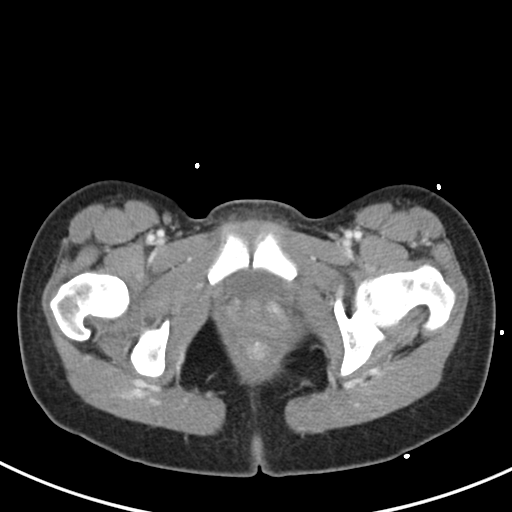
[im 17/75  soft-tissue]
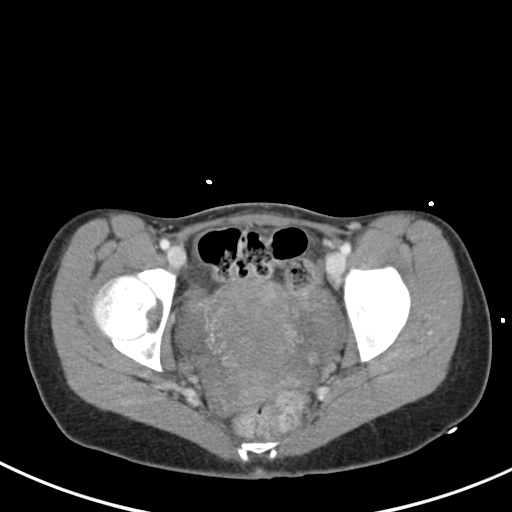
[im 20/75  soft-tissue]
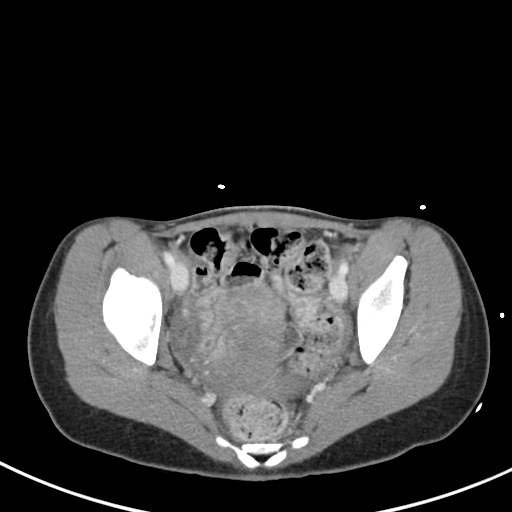
[im 26/75  soft-tissue]
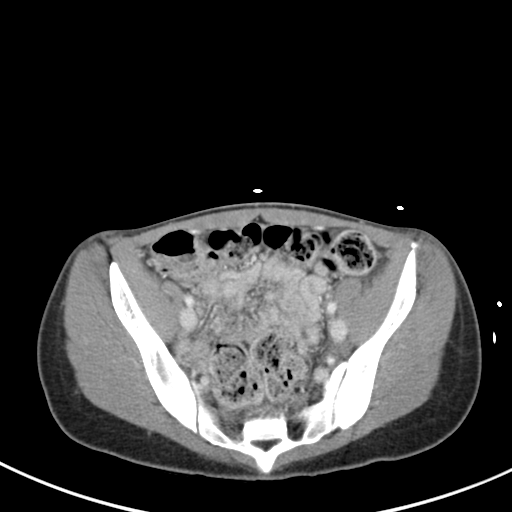
[im 33/75  soft-tissue]
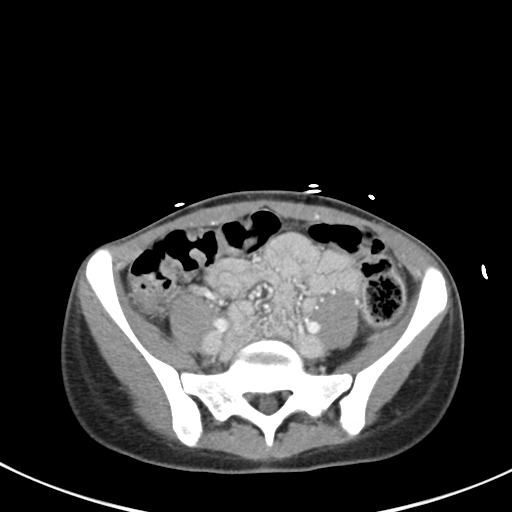
[im 39/75  soft-tissue]
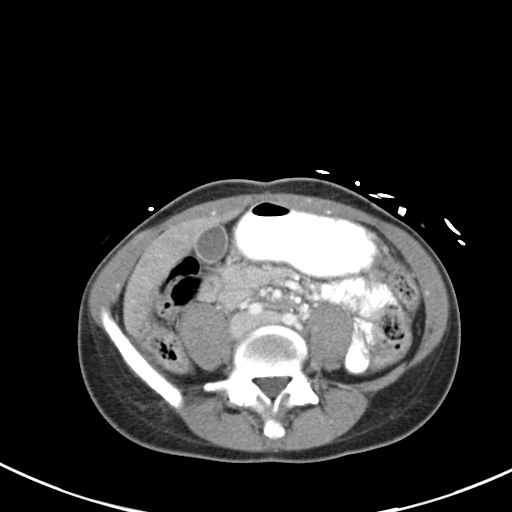
[im 42/75  soft-tissue]
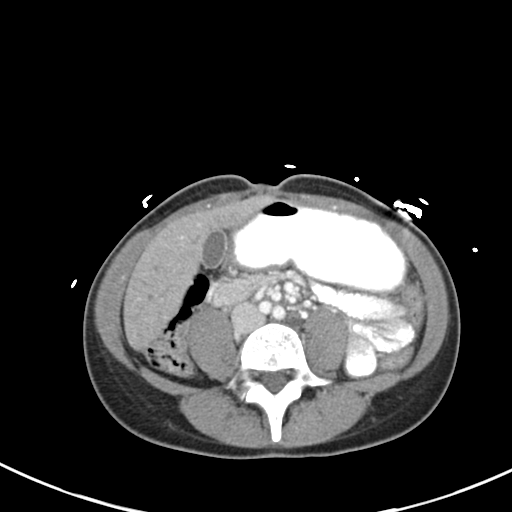
[im 49/75  soft-tissue]
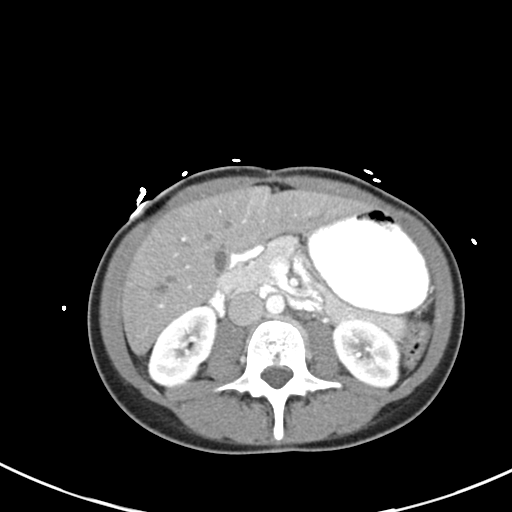
[im 49/75  bone]
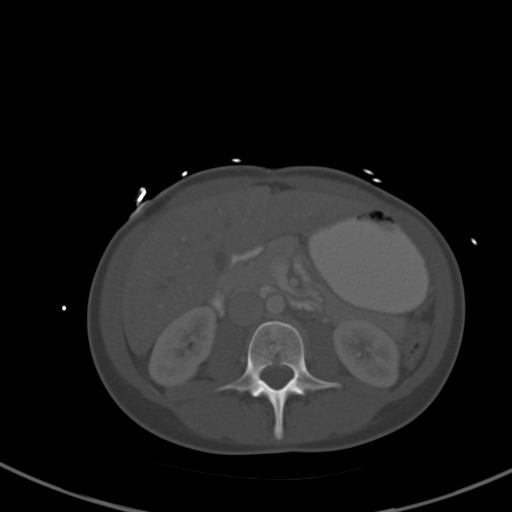
[im 55/75  soft-tissue]
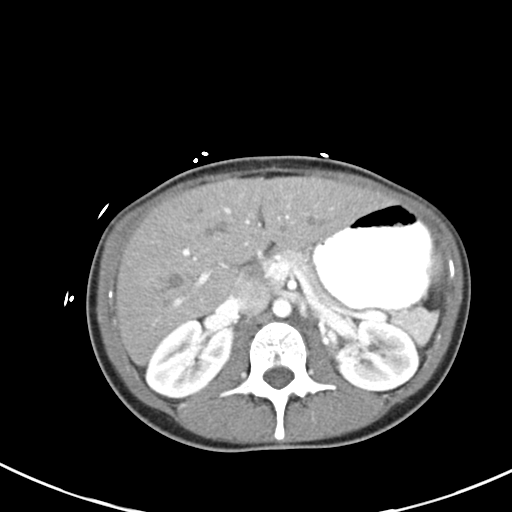
[im 58/75  soft-tissue]
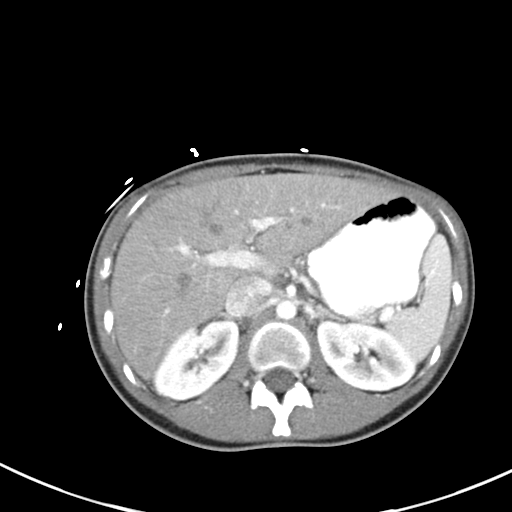
[im 65/75  soft-tissue]
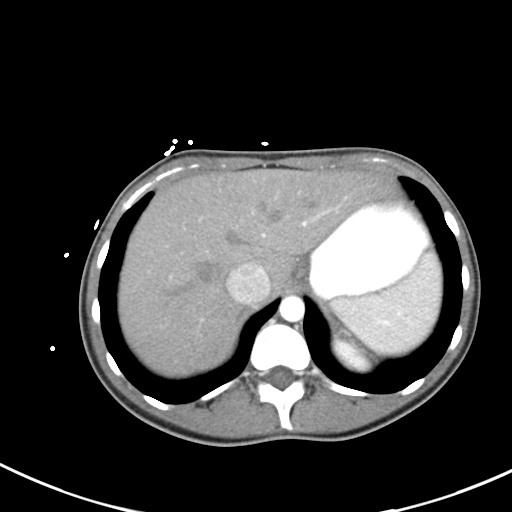
[im 71/75  soft-tissue]
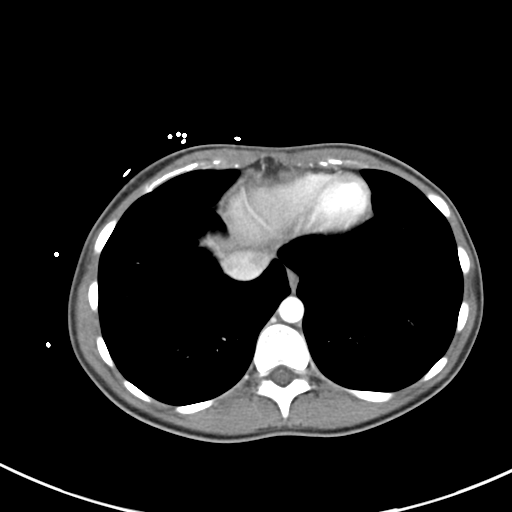

[Series 3: coronal a/|p · coronal · 0.59mm/px · 3 of 69 slices shown]
[im 23/69  soft-tissue]
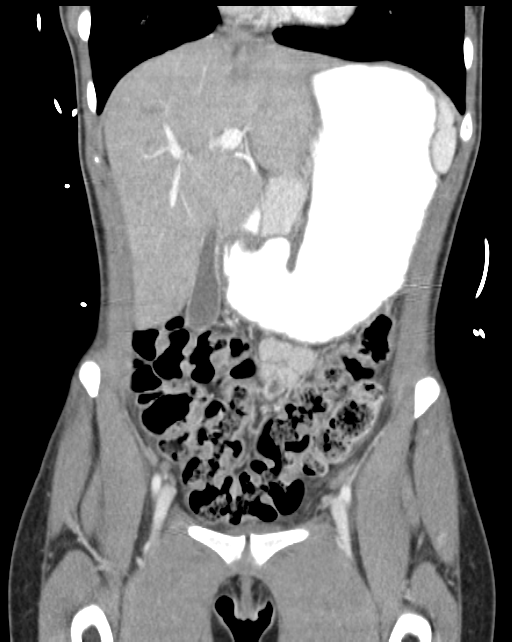
[im 31/69  soft-tissue]
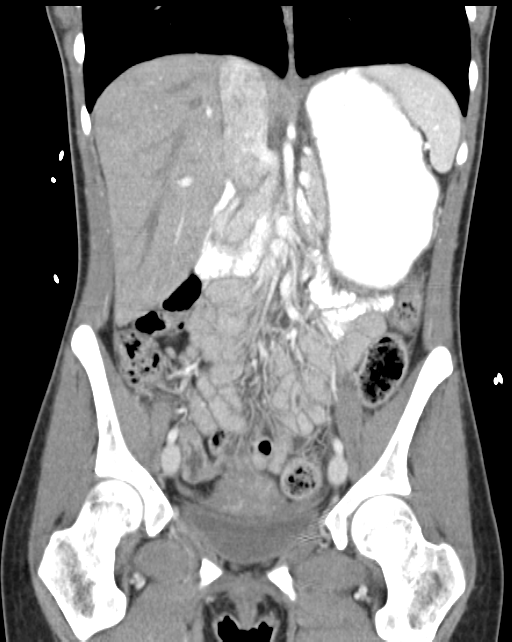
[im 38/69  soft-tissue]
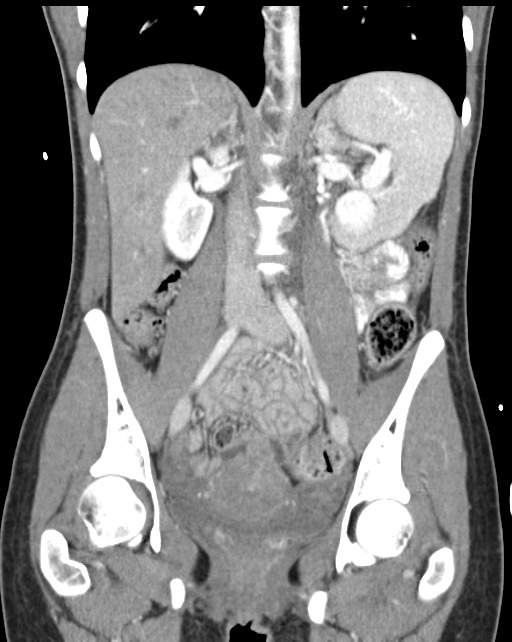

[16 of 46 positions shown; findings below may reference images not displayed]

FINDINGS: The visualized lung bases are clear.

The liver and spleen are unremarkable in appearance. The gallbladder
is within normal limits. The pancreas and adrenal glands are
unremarkable.

The kidneys are unremarkable in appearance. There is no evidence of
hydronephrosis. No renal or ureteral stones are seen. No perinephric
stranding is appreciated.

The small bowel is unremarkable in appearance. The stomach is within
normal limits. No acute vascular abnormalities are seen.

The colon is unremarkable in appearance. The patient is status post
appendectomy.

The bladder is mildly distended and grossly unremarkable. The uterus
is unremarkable in appearance. The ovaries are grossly symmetric. No
suspicious adnexal masses are seen. Trace fluid in the pelvis is
likely physiologic in nature. Mild prominence of the periuterine
vasculature is likely within normal limits. No inguinal
lymphadenopathy is seen.

No acute osseous abnormalities are identified.
IMPRESSION: Unremarkable contrast-enhanced CT of the abdomen and pelvis.

## 2016-10-19 ENCOUNTER — Ambulatory Visit (INDEPENDENT_AMBULATORY_CARE_PROVIDER_SITE_OTHER): Payer: BLUE CROSS/BLUE SHIELD | Admitting: Student

## 2016-10-19 VITALS — BP 114/72 | HR 71 | Temp 97.9°F | Resp 16 | Ht 61.0 in | Wt 94.6 lb

## 2016-10-19 DIAGNOSIS — R5383 Other fatigue: Secondary | ICD-10-CM

## 2016-10-19 NOTE — Assessment & Plan Note (Addendum)
Will check CBC, CMP, TSH, vitamin D, vitamin B12.  May be due to metomenorrhagia.  Follow up 1 week to check blood work. Gave reassurance that breast tissue was not a mass.

## 2016-10-19 NOTE — Patient Instructions (Addendum)
  Please get blood work and follow up next Tuesday to go over results.   IF you received an x-ray today, you will receive an invoice from [facility]. Please contact [facility] at [phone] with questions or concerns regarding your invoice.   IF you received labwork today, you will receive an invoice from [facility]. Please contact [facility] at [phone] with questions or concerns regarding your invoice.   Our billing staff will not be able to assist you with questions regarding bills from these companies.  You will be contacted with the lab results as soon as they are available. The fastest way to get your results is to activate your My Chart account. Instructions are located on the last page of this paperwork. If you have not heard from us regarding the results in 2 weeks, please contact this office.

## 2016-10-19 NOTE — Progress Notes (Signed)
   Subjective:    Patient ID: Vicki Boone, female    DOB: 1996-04-26, 21 y.o.   MRN: [id number]  HPI Spanish speaking female, interpreter used.  She presents after palpating a mass on her breast that hurts a little bit.  She felt it while dressing for work.  She denies fluctuance, redness, swelling.  Worse with menses.  LMP 10/18/16.   Complains of fatigue that has been ongoing for past 2 months.  Occasional SOB.  She had her menses twice the past 2 months.  Denies chest pain, syncope, paresthesias.  Denies fevers, chills, nasal congestion, ear pain.  Mild sore throat.   She is not sexually active and has decreased libido.   Review of Systems  Constitutional: Positive for fatigue. Negative for chills, fever and unexpected weight change.  HENT: Positive for sore throat. Negative for congestion, ear pain, postnasal drip, rhinorrhea, trouble swallowing and voice change.   Respiratory: Positive for shortness of breath. Negative for cough and chest tightness.   Cardiovascular: Negative for chest pain, palpitations and leg swelling.  Gastrointestinal: Negative for abdominal pain and nausea.  Genitourinary: Negative for dysuria and urgency.  Musculoskeletal: Negative for arthralgias and joint swelling.  Skin: Negative for color change, pallor, rash and wound.  Psychiatric/Behavioral: Negative for agitation and confusion.  All other systems reviewed and are negative.      Objective:   Physical Exam  Constitutional: She is oriented to person, place, and time. She appears well-developed and well-nourished. No distress.  HENT:  Head: Normocephalic and atraumatic.  Right Ear: External ear normal.  Left Ear: External ear normal.  Nose: Nose normal.  Mouth/Throat: Oropharynx is clear and moist. No oropharyngeal exudate.  Neck: Normal range of motion. Neck supple. No thyromegaly present.  Cardiovascular: Normal rate, regular rhythm, normal heart sounds and intact distal pulses.  Exam  reveals no gallop and no friction rub.   No murmur heard. Pulmonary/Chest: Effort normal and breath sounds normal. No respiratory distress. She has no wheezes. She has no rales. She exhibits no tenderness.    Musculoskeletal: Normal range of motion. She exhibits no edema.  Lymphadenopathy:    She has no cervical adenopathy.  Neurological: She is alert and oriented to person, place, and time.  Skin: Skin is warm. No rash noted. She is not diaphoretic. No erythema.  Psychiatric: She has a normal mood and affect. Her behavior is normal. Judgment and thought content normal.  Nursing note and vitals reviewed.   BP 114/72 (BP Location: Right Arm, Patient Position: Sitting, Cuff Size: Normal)   Pulse 71   Temp 97.9 F (36.6 C) (Oral)   Resp 16   Ht 5\' 1"  (1.549 m)   Wt 94 lb 9.6 oz (42.9 kg)   SpO2 99%   BMI 17.87 kg/m        Assessment & Plan:  Fatigue Will check CBC, CMP, TSH, vitamin D, vitamin B12.  May be due to metomenorrhagia.  Follow up 1 week to check blood work.

## 2016-10-20 LAB — CBC WITH DIFFERENTIAL/PLATELET
Basophils Absolute: 0 10*3/uL (ref 0.0–0.2)
Basos: 1 %
EOS (ABSOLUTE): 0.3 10*3/uL (ref 0.0–0.4)
EOS: 4 %
HEMATOCRIT: 41.8 % (ref 34.0–46.6)
HEMOGLOBIN: 14.4 g/dL (ref 11.1–15.9)
IMMATURE GRANS (ABS): 0 10*3/uL (ref 0.0–0.1)
IMMATURE GRANULOCYTES: 0 %
Lymphocytes Absolute: 3.3 10*3/uL — ABNORMAL HIGH (ref 0.7–3.1)
Lymphs: 46 %
MCH: 29.8 pg (ref 26.6–33.0)
MCHC: 34.4 g/dL (ref 31.5–35.7)
MCV: 86 fL (ref 79–97)
MONOCYTES: 7 %
Monocytes Absolute: 0.5 10*3/uL (ref 0.1–0.9)
NEUTROS PCT: 42 %
Neutrophils Absolute: 3 10*3/uL (ref 1.4–7.0)
Platelets: 376 10*3/uL (ref 150–379)
RBC: 4.84 x10E6/uL (ref 3.77–5.28)
RDW: 13 % (ref 12.3–15.4)
WBC: 7.1 10*3/uL (ref 3.4–10.8)

## 2016-10-20 LAB — COMPREHENSIVE METABOLIC PANEL
A/G RATIO: 1.6 (ref 1.2–2.2)
ALT: 17 IU/L (ref 0–32)
AST: 24 IU/L (ref 0–40)
Albumin: 4.3 g/dL (ref 3.5–5.5)
Alkaline Phosphatase: 72 IU/L (ref 39–117)
BUN/Creatinine Ratio: 14 (ref 9–23)
BUN: 8 mg/dL (ref 6–20)
Bilirubin Total: 0.4 mg/dL (ref 0.0–1.2)
CALCIUM: 9.3 mg/dL (ref 8.7–10.2)
CO2: 23 mmol/L (ref 18–29)
Chloride: 99 mmol/L (ref 96–106)
Creatinine, Ser: 0.58 mg/dL (ref 0.57–1.00)
GFR calc Af Amer: 153 mL/min/{1.73_m2} (ref >59)
GFR, EST NON AFRICAN AMERICAN: 133 mL/min/{1.73_m2} (ref >59)
GLUCOSE: 86 mg/dL (ref 65–99)
Globulin, Total: 2.7 g/dL (ref 1.5–4.5)
POTASSIUM: 4.3 mmol/L (ref 3.5–5.2)
Sodium: 140 mmol/L (ref 134–144)
TOTAL PROTEIN: 7 g/dL (ref 6.0–8.5)

## 2016-10-20 LAB — VITAMIN B12: VITAMIN B 12: 519 pg/mL (ref 232–1245)

## 2016-10-20 LAB — VITAMIN D 25 HYDROXY (VIT D DEFICIENCY, FRACTURES): VIT D 25 HYDROXY: 14.3 ng/mL — AB (ref 30.0–100.0)

## 2016-10-20 LAB — TSH: TSH: 1.4 u[IU]/mL (ref 0.450–4.500)

## 2016-10-26 ENCOUNTER — Ambulatory Visit (INDEPENDENT_AMBULATORY_CARE_PROVIDER_SITE_OTHER): Payer: BLUE CROSS/BLUE SHIELD | Admitting: Student

## 2016-10-26 VITALS — BP 116/70 | HR 86 | Temp 98.5°F | Resp 18 | Ht 61.0 in | Wt 95.6 lb

## 2016-10-26 DIAGNOSIS — J111 Influenza due to unidentified influenza virus with other respiratory manifestations: Secondary | ICD-10-CM | POA: Insufficient documentation

## 2016-10-26 DIAGNOSIS — E559 Vitamin D deficiency, unspecified: Secondary | ICD-10-CM | POA: Diagnosis not present

## 2016-10-26 MED ORDER — GUAIFENESIN-CODEINE 100-10 MG/5ML PO SYRP: 5.0000 mL | ORAL_SOLUTION | Freq: Three times a day (TID) | ORAL | 0 refills | Status: AC | PRN

## 2016-10-26 MED ORDER — DM-GUAIFENESIN ER 30-600 MG PO TB12: 1.0000 | ORAL_TABLET | Freq: Two times a day (BID) | ORAL | 0 refills | Status: AC

## 2016-10-26 MED ORDER — VITAMIN D (ERGOCALCIFEROL) 1.25 MG (50000 UNIT) PO CAPS: 50000.0000 [IU] | ORAL_CAPSULE | ORAL | 0 refills | Status: AC

## 2016-10-26 NOTE — Assessment & Plan Note (Signed)
Recommend supportive care and cheratussin to help with cough at night.

## 2016-10-26 NOTE — Assessment & Plan Note (Signed)
Prescribed 50,000 units to take once weekly for 12 weeks, then will need recheck.  She will need to repeat or take 2000 units daily.

## 2016-10-26 NOTE — Patient Instructions (Signed)
     IF you received an x-ray today, you will receive an invoice from [facility]. Please contact [facility] at [phone] with questions or concerns regarding your invoice.   IF you received labwork today, you will receive an invoice from [facility]. Please contact [facility] at [phone] with questions or concerns regarding your invoice.   Our billing staff will not be able to assist you with questions regarding bills from these companies.  You will be contacted with the lab results as soon as they are available. The fastest way to get your results is to activate your My Chart account. Instructions are located on the last page of this paperwork. If you have not heard from us regarding the results in 2 weeks, please contact this office.     

## 2016-10-26 NOTE — Progress Notes (Addendum)
   Subjective:    Patient ID: Vicki BobAna P Boone, female    DOB: 01-14-1996, 21 y.o.   MRN: [id number]  HPI Presents for follow up for blood work from last week.  She was having fatigue at the time and was worried about anemia.  She was found to have low Vitamin D.  She also complains of cough, congestion, myalgias, fevers, chills.  This has been ongoing for past 5 days.  She was seen in the ED and diagnosed with the flu.  She does not need a note for work.  She is having trouble sleeping due to her cough.  She is requesting some help with this.    PMHx - Updated and reviewed.  Contributory factors include: Negative PSHx - Updated and reviewed.  Contributory factors include:  Negative FHx - Updated and reviewed.  Contributory factors include:  Negative Social Hx - Updated and reviewed. Contributory factors include: Negative Medications - reviewed     Review of Systems  Constitutional: Positive for fever. Negative for chills and fatigue.  HENT: Positive for congestion. Negative for ear pain, rhinorrhea and sore throat.   Eyes: Negative for discharge and redness.  Respiratory: Negative for cough, chest tightness and shortness of breath.   Cardiovascular: Negative for chest pain, palpitations and leg swelling.  Gastrointestinal: Negative for abdominal pain and nausea.  Genitourinary: Negative for dysuria and urgency.  Musculoskeletal: Positive for myalgias. Negative for arthralgias and joint swelling.  Skin: Negative for rash and wound.  Psychiatric/Behavioral: Negative for agitation and confusion.  All other systems reviewed and are negative.      Objective:   Physical Exam  Constitutional: She is oriented to person, place, and time. She appears well-developed and well-nourished. No distress.  HENT:  Head: Normocephalic and atraumatic.  Right Ear: External ear normal.  Left Ear: External ear normal.  Mouth/Throat: No oropharyngeal exudate.  Mild nasal congestion, edematous  turbinates  Neck: Normal range of motion. Neck supple.  Cardiovascular: Normal rate, regular rhythm, normal heart sounds and intact distal pulses.  Exam reveals no gallop and no friction rub.   No murmur heard. Pulmonary/Chest: Effort normal and breath sounds normal. No respiratory distress. She has no wheezes. She has no rales. She exhibits no tenderness.  Musculoskeletal: Normal range of motion. She exhibits no edema.  Lymphadenopathy:    She has no cervical adenopathy.  Neurological: She is alert and oriented to person, place, and time.  Skin: Skin is warm. No rash noted. She is not diaphoretic. No erythema.  Psychiatric: She has a normal mood and affect. Her behavior is normal. Judgment and thought content normal.  Nursing note and vitals reviewed.  BP 116/70 (BP Location: Right Arm, Patient Position: Sitting, Cuff Size: Small)   Pulse 86   Temp 98.5 F (36.9 C) (Oral)   Resp 18   Ht 5\' 1"  (1.549 m)   Wt 95 lb 9.6 oz (43.4 kg)   LMP 10/24/2016   SpO2 100%   BMI 18.06 kg/m         Assessment & Plan:  Vitamin D deficiency Prescribed 50,000 units to take once weekly for 12 weeks, then will need recheck.  She will need to repeat or take 2000 units daily.  Influenza with respiratory manifestation Recommend supportive care and cheratussin to help with cough at night.  Signed,  Corliss MarcusAlicia Barnes, DO [facility] 8:42 PM 10/26/16
